# Patient Record
Sex: Male | Born: 1965 | Race: White | Hispanic: No | Marital: Single | Smoking: Current some day smoker
Health system: Southern US, Community
[De-identification: ages and names within clinical notes are randomized; demographics above are authoritative.]

## PROBLEM LIST (undated history)

## (undated) DIAGNOSIS — F419 Anxiety disorder, unspecified: Secondary | ICD-10-CM

## (undated) DIAGNOSIS — F329 Major depressive disorder, single episode, unspecified: Secondary | ICD-10-CM

## (undated) DIAGNOSIS — F32A Depression, unspecified: Secondary | ICD-10-CM

## (undated) DIAGNOSIS — F1010 Alcohol abuse, uncomplicated: Secondary | ICD-10-CM

---

## 2008-01-22 ENCOUNTER — Emergency Department (HOSPITAL_COMMUNITY): Admission: EM | Admit: 2008-01-22 | Discharge: 2008-01-22 | Payer: Self-pay | Admitting: Emergency Medicine

## 2009-04-30 ENCOUNTER — Emergency Department (HOSPITAL_COMMUNITY): Admission: EM | Admit: 2009-04-30 | Discharge: 2009-04-30 | Payer: Self-pay | Admitting: Emergency Medicine

## 2009-11-20 ENCOUNTER — Emergency Department (HOSPITAL_COMMUNITY): Admission: EM | Admit: 2009-11-20 | Discharge: 2009-11-21 | Payer: Self-pay | Admitting: Emergency Medicine

## 2009-12-11 ENCOUNTER — Emergency Department (HOSPITAL_COMMUNITY): Admission: EM | Admit: 2009-12-11 | Discharge: 2009-12-12 | Payer: Self-pay | Admitting: Emergency Medicine

## 2010-09-23 IMAGING — CR DG CHEST 2V
2 series · 2 of 2 positions shown · non-contrast
Comparison: None

CLINICAL DATA: Weakness

CHEST - 2 VIEW

[w chest pa]
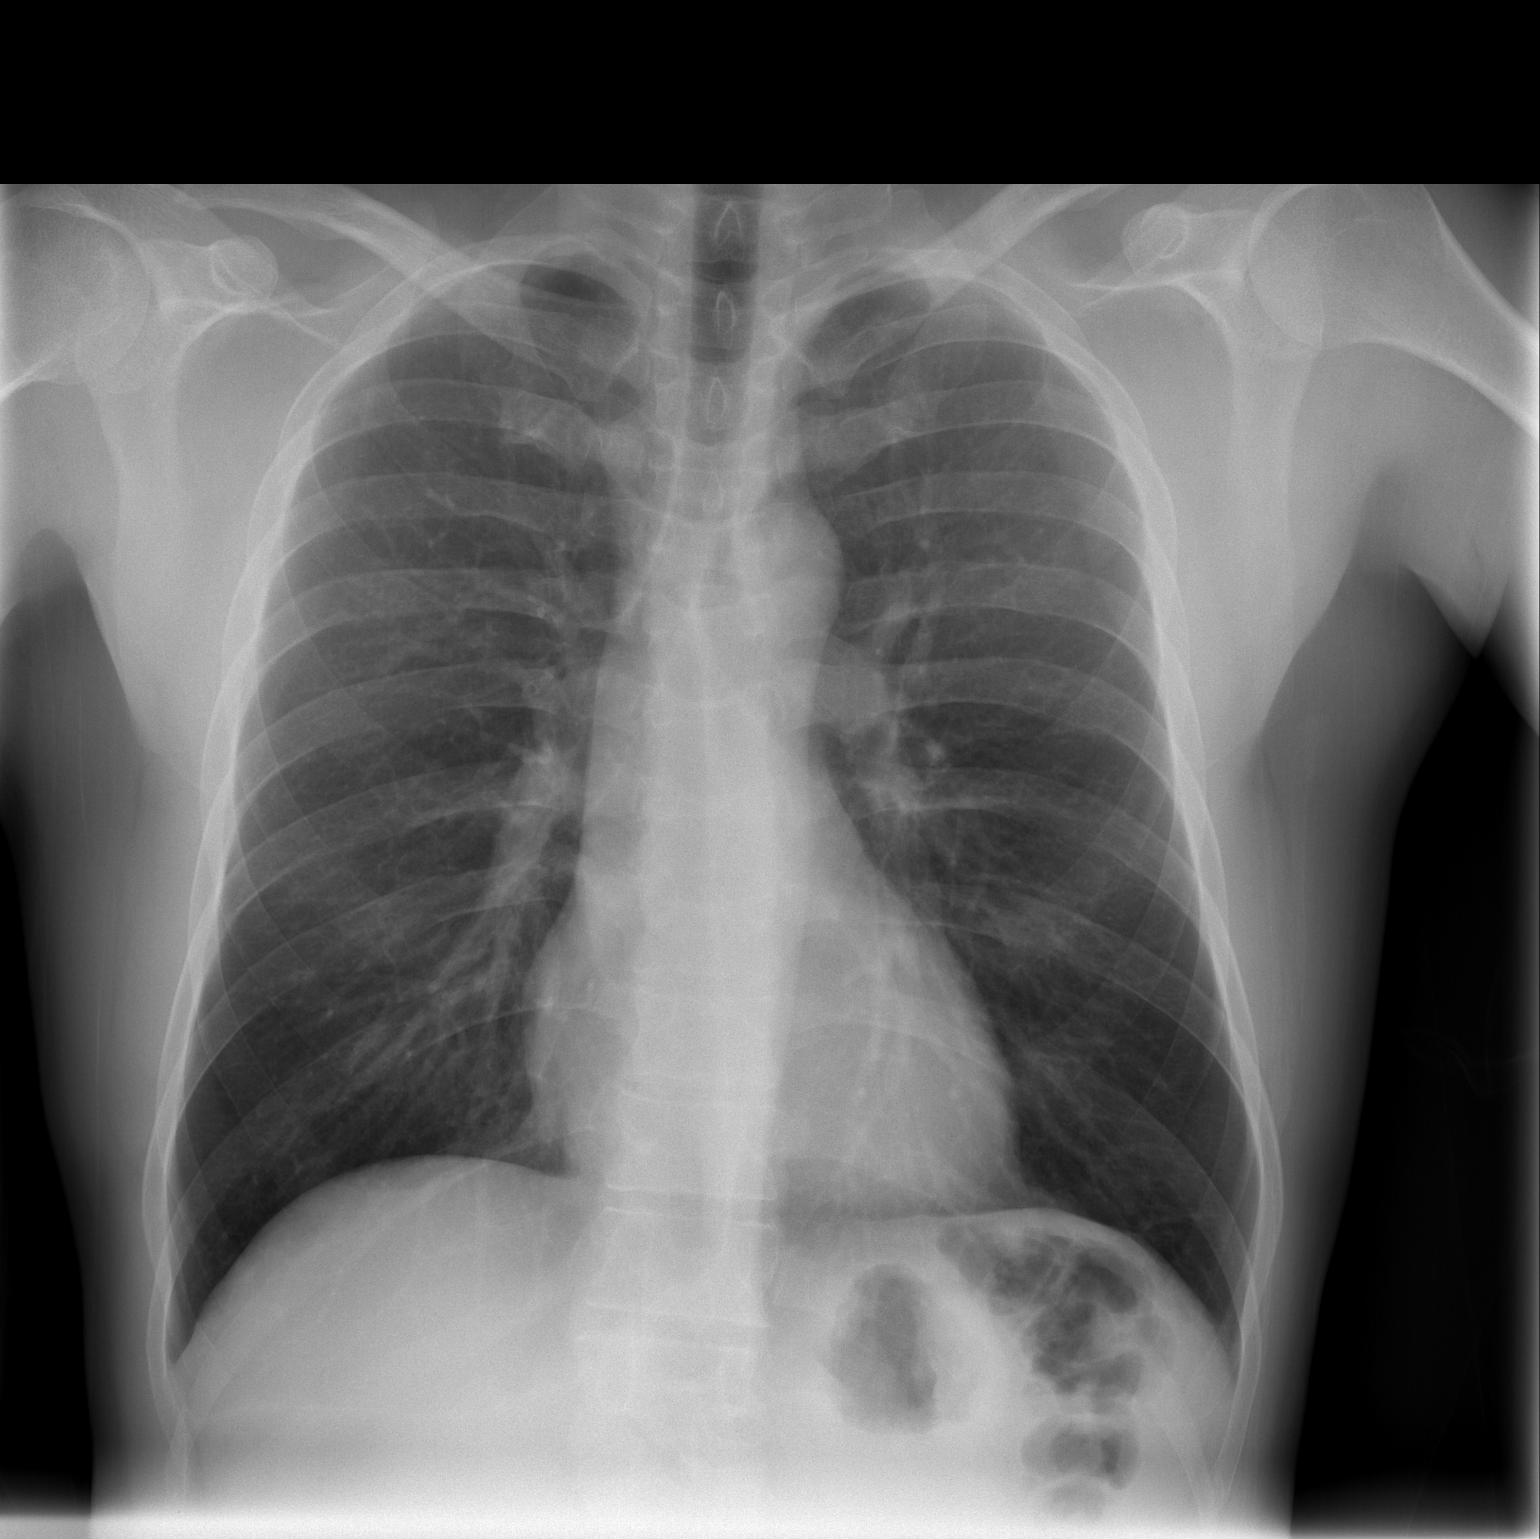

[w chest lat]
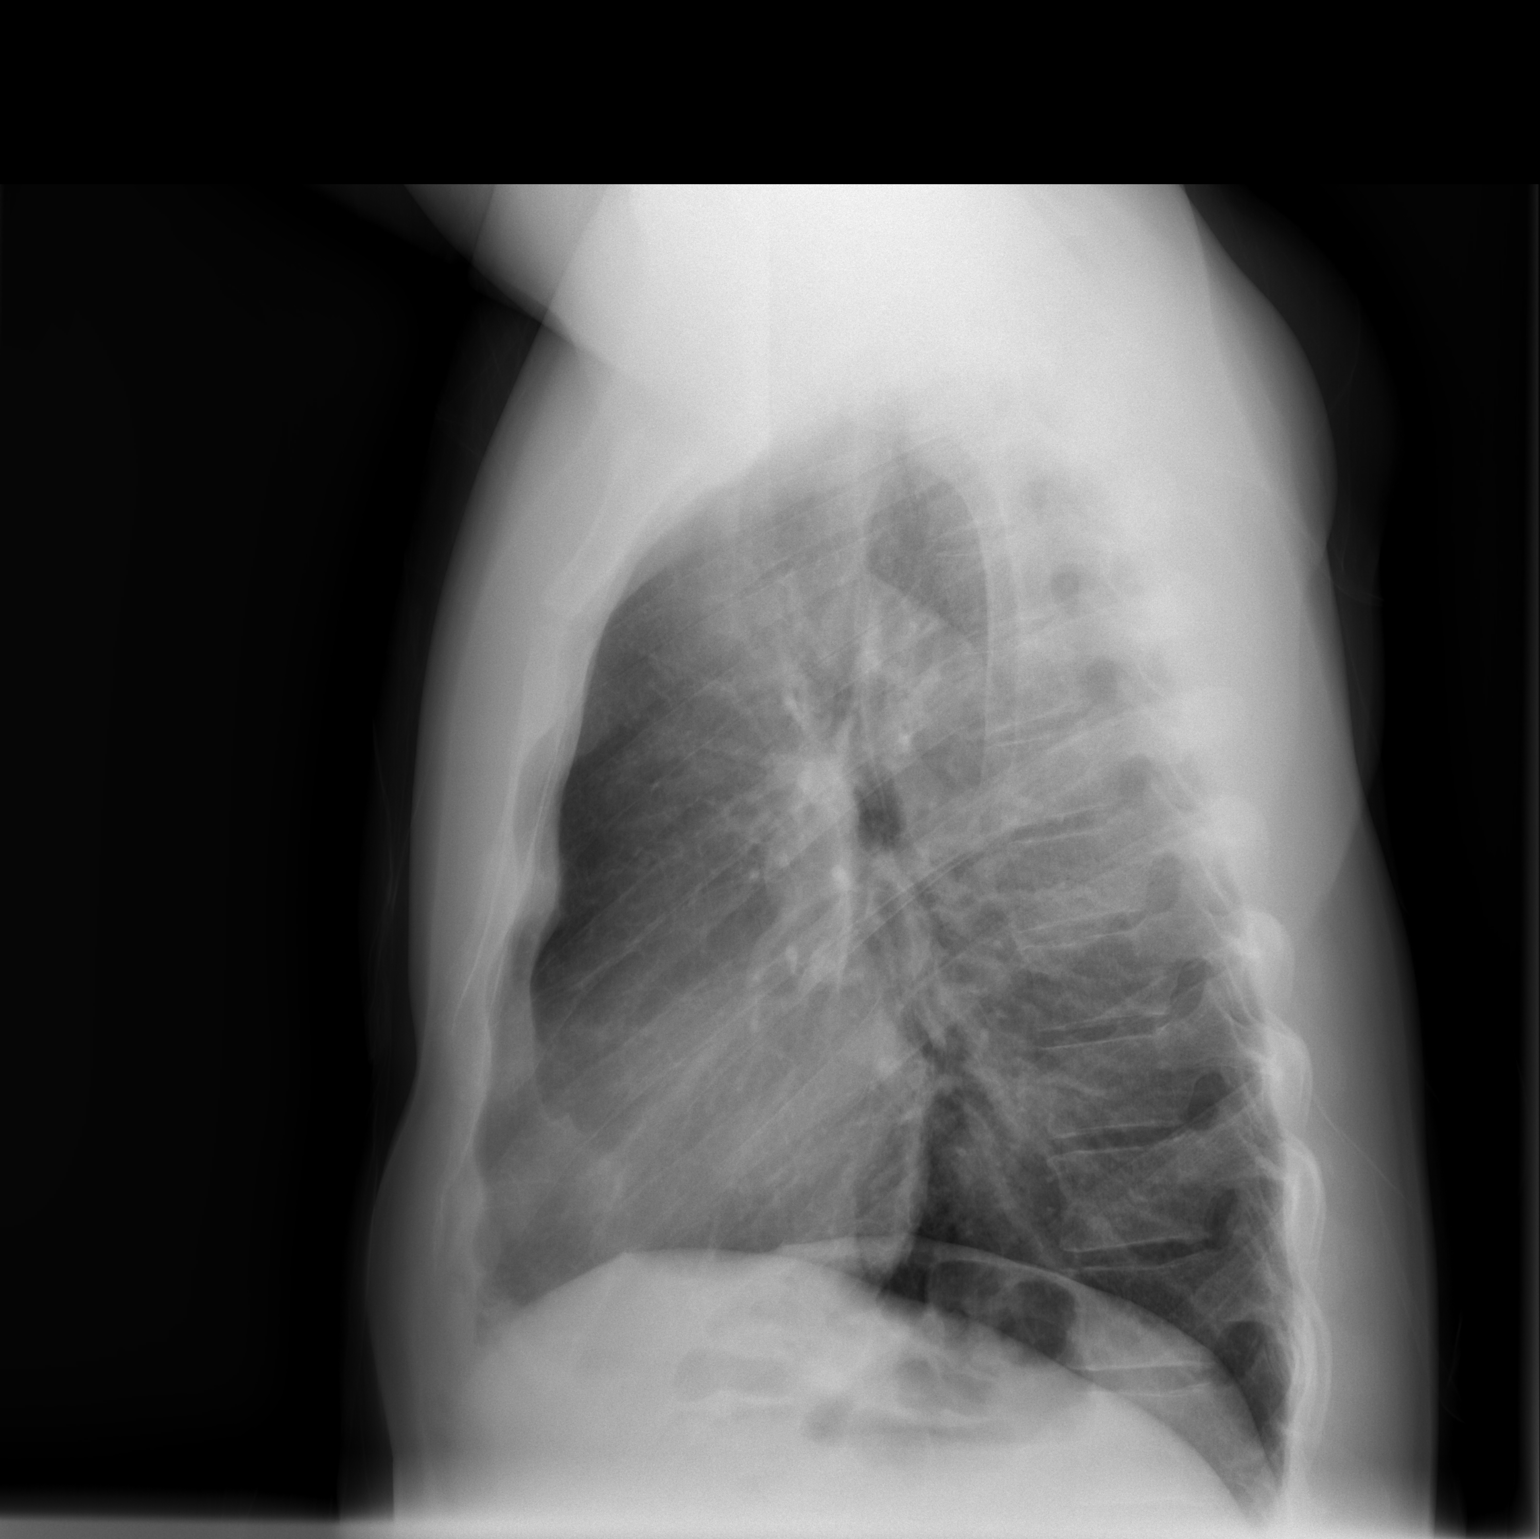

[2 of 2 positions shown; findings below may reference images not displayed]

FINDINGS: The heart and mediastinal contours are within normal
limits.  The lungs are normally aerated and the lung fields are
clear.  No pleural effusion or pneumothorax is identified.  The
trachea is midline.  No evidence of lymphadenopathy.  No acute bony
abnormality is identified.
IMPRESSION: No evidence of acute cardiopulmonary disease.

## 2010-10-24 LAB — CBC
HCT: 45.7 % (ref 39.0–52.0)
Hemoglobin: 15.4 g/dL (ref 13.0–17.0)
RBC: 4.65 MIL/uL (ref 4.22–5.81)
WBC: 7.4 10*3/uL (ref 4.0–10.5)

## 2010-10-24 LAB — DIFFERENTIAL
Basophils Absolute: 0 10*3/uL (ref 0.0–0.1)
Eosinophils Relative: 0 % (ref 0–5)
Lymphocytes Relative: 34 % (ref 12–46)
Lymphs Abs: 2.5 10*3/uL (ref 0.7–4.0)
Monocytes Absolute: 0.5 10*3/uL (ref 0.1–1.0)
Neutro Abs: 4.3 10*3/uL (ref 1.7–7.7)

## 2010-10-24 LAB — ETHANOL
Alcohol, Ethyl (B): 214 mg/dL — ABNORMAL HIGH (ref 0–10)
Alcohol, Ethyl (B): 289 mg/dL — ABNORMAL HIGH (ref 0–10)

## 2010-10-24 LAB — POCT I-STAT, CHEM 8
Chloride: 106 mEq/L (ref 96–112)
Glucose, Bld: 87 mg/dL (ref 70–99)
HCT: 49 % (ref 39.0–52.0)
Hemoglobin: 16.7 g/dL (ref 13.0–17.0)
Potassium: 3.5 mEq/L (ref 3.5–5.1)
Sodium: 143 mEq/L (ref 135–145)

## 2010-10-24 LAB — RAPID URINE DRUG SCREEN, HOSP PERFORMED
Amphetamines: NOT DETECTED
Amphetamines: NOT DETECTED
Barbiturates: NOT DETECTED
Benzodiazepines: NOT DETECTED
Benzodiazepines: NOT DETECTED
Cocaine: POSITIVE — AB
Tetrahydrocannabinol: NOT DETECTED

## 2010-10-24 LAB — BASIC METABOLIC PANEL
Calcium: 8.9 mg/dL (ref 8.4–10.5)
GFR calc Af Amer: >60 mL/min (ref >60)
GFR calc non Af Amer: >60 mL/min (ref >60)
Potassium: 3.5 mEq/L (ref 3.5–5.1)
Sodium: 138 mEq/L (ref 135–145)

## 2010-10-24 LAB — TRICYCLICS SCREEN, URINE: TCA Scrn: NOT DETECTED

## 2010-11-10 LAB — POCT I-STAT, CHEM 8
HCT: 42 % (ref 39.0–52.0)
Hemoglobin: 14.3 g/dL (ref 13.0–17.0)
Potassium: 3.5 mEq/L (ref 3.5–5.1)
Sodium: 136 mEq/L (ref 135–145)
TCO2: 23 mmol/L (ref 0–100)

## 2010-11-10 LAB — URINALYSIS, ROUTINE W REFLEX MICROSCOPIC
Hgb urine dipstick: NEGATIVE
Nitrite: NEGATIVE
Protein, ur: NEGATIVE mg/dL
Specific Gravity, Urine: 1.004 — ABNORMAL LOW (ref 1.005–1.030)
Urobilinogen, UA: 0.2 mg/dL (ref 0.0–1.0)

## 2010-11-10 LAB — RAPID URINE DRUG SCREEN, HOSP PERFORMED
Amphetamines: NOT DETECTED
Barbiturates: NOT DETECTED

## 2010-11-10 LAB — CBC
HCT: 40.8 % (ref 39.0–52.0)
MCHC: 34.7 g/dL (ref 30.0–36.0)
MCV: 90.6 fL (ref 78.0–100.0)
Platelets: 229 10*3/uL (ref 150–400)
RBC: 4.5 MIL/uL (ref 4.22–5.81)
WBC: 7.1 10*3/uL (ref 4.0–10.5)

## 2010-11-10 LAB — DIFFERENTIAL
Basophils Relative: 0 % (ref 0–1)
Eosinophils Absolute: 0 10*3/uL (ref 0.0–0.7)
Eosinophils Relative: 0 % (ref 0–5)
Lymphs Abs: 2.2 10*3/uL (ref 0.7–4.0)
Monocytes Relative: 7 % (ref 3–12)
Neutrophils Relative %: 61 % (ref 43–77)

## 2010-11-10 LAB — URINE CULTURE
Colony Count: NO GROWTH
Culture: NO GROWTH

## 2011-05-03 LAB — CBC
HCT: 48.5
Hemoglobin: 16.5
MCHC: 34
MCV: 95.9
Platelets: 264
RBC: 5.06
RDW: 13.7
WBC: 7.9

## 2011-05-03 LAB — BASIC METABOLIC PANEL
BUN: 6
Chloride: 103
GFR calc non Af Amer: >60
Glucose, Bld: 94
Potassium: 4.4

## 2011-05-03 LAB — DIFFERENTIAL
Basophils Absolute: 0
Basophils Relative: 1
Eosinophils Absolute: 0.1
Eosinophils Relative: 1
Lymphocytes Relative: 23
Lymphs Abs: 1.9
Monocytes Absolute: 0.6
Monocytes Relative: 8
Neutro Abs: 5.3
Neutrophils Relative %: 67

## 2011-05-03 LAB — BASIC METABOLIC PANEL WITH GFR
CO2: 28
Calcium: 9.8
Creatinine, Ser: 0.91
GFR calc Af Amer: >60
Sodium: 139

## 2016-06-28 ENCOUNTER — Emergency Department (HOSPITAL_COMMUNITY)
Admission: EM | Admit: 2016-06-28 | Discharge: 2016-06-28 | Disposition: A | Discharge status: Home / Self Care | Payer: Self-pay | Attending: Emergency Medicine | Admitting: Emergency Medicine

## 2016-06-28 ENCOUNTER — Encounter (HOSPITAL_COMMUNITY): Payer: Self-pay

## 2016-06-28 DIAGNOSIS — F17210 Nicotine dependence, cigarettes, uncomplicated: Secondary | ICD-10-CM | POA: Insufficient documentation

## 2016-06-28 DIAGNOSIS — Z79899 Other long term (current) drug therapy: Secondary | ICD-10-CM | POA: Insufficient documentation

## 2016-06-28 DIAGNOSIS — F1010 Alcohol abuse, uncomplicated: Secondary | ICD-10-CM | POA: Insufficient documentation

## 2016-06-28 HISTORY — DX: Depression, unspecified: F32.A

## 2016-06-28 HISTORY — DX: Anxiety disorder, unspecified: F41.9

## 2016-06-28 HISTORY — DX: Major depressive disorder, single episode, unspecified: F32.9

## 2016-06-28 HISTORY — DX: Alcohol abuse, uncomplicated: F10.10

## 2016-06-28 LAB — CBC
HCT: 42.3 % (ref 39.0–52.0)
Hemoglobin: 14.6 g/dL (ref 13.0–17.0)
MCH: 31.3 pg (ref 26.0–34.0)
MCHC: 34.5 g/dL (ref 30.0–36.0)
MCV: 90.8 fL (ref 78.0–100.0)
PLATELETS: 293 10*3/uL (ref 150–400)
RBC: 4.66 MIL/uL (ref 4.22–5.81)
RDW: 13.2 % (ref 11.5–15.5)
WBC: 7.5 10*3/uL (ref 4.0–10.5)

## 2016-06-28 LAB — COMPREHENSIVE METABOLIC PANEL
ALK PHOS: 73 U/L (ref 38–126)
ALT: 69 U/L — AB (ref 17–63)
ANION GAP: 8 (ref 5–15)
AST: 57 U/L — ABNORMAL HIGH (ref 15–41)
Albumin: 4.2 g/dL (ref 3.5–5.0)
BUN: 7 mg/dL (ref 6–20)
CALCIUM: 8.6 mg/dL — AB (ref 8.9–10.3)
CO2: 27 mmol/L (ref 22–32)
CREATININE: 0.8 mg/dL (ref 0.61–1.24)
Chloride: 107 mmol/L (ref 101–111)
Glucose, Bld: 101 mg/dL — ABNORMAL HIGH (ref 65–99)
Potassium: 3.4 mmol/L — ABNORMAL LOW (ref 3.5–5.1)
SODIUM: 142 mmol/L (ref 135–145)
TOTAL PROTEIN: 7 g/dL (ref 6.5–8.1)
Total Bilirubin: 1.1 mg/dL (ref 0.3–1.2)

## 2016-06-28 LAB — RAPID URINE DRUG SCREEN, HOSP PERFORMED
AMPHETAMINES: NOT DETECTED
BENZODIAZEPINES: POSITIVE — AB
Barbiturates: NOT DETECTED
COCAINE: NOT DETECTED
OPIATES: NOT DETECTED
Tetrahydrocannabinol: POSITIVE — AB

## 2016-06-28 LAB — ETHANOL: ALCOHOL ETHYL (B): 158 mg/dL — AB (ref <5)

## 2016-06-28 MED ORDER — FOLIC ACID 1 MG PO TABS: 1.0000 mg | ORAL_TABLET | Freq: Every day | ORAL | 0 refills | Status: DC

## 2016-06-28 MED ORDER — CHLORDIAZEPOXIDE HCL 25 MG PO CAPS: ORAL_CAPSULE | ORAL | 0 refills | Status: DC

## 2016-06-28 MED ORDER — VITAMIN B-1 100 MG PO TABS: 100.0000 mg | ORAL_TABLET | Freq: Every day | ORAL | 0 refills | Status: DC

## 2016-06-28 MED ORDER — LORAZEPAM 1 MG PO TABS
1.0000 mg | ORAL_TABLET | Freq: Once | ORAL | Status: AC
  Administered 2016-06-28: 1 mg via ORAL
  Filled 2016-06-28: qty 1

## 2016-06-28 MED ORDER — ONE-A-DAY MENS PO TABS: 1.0000 | ORAL_TABLET | Freq: Every day | ORAL | 0 refills | Status: DC

## 2016-06-28 NOTE — ED Provider Notes (Signed)
[facility]-EMERGENCY DEPT Provider Note   CSN: [id number] Arrival date & time: 06/28/16  1807     History   Chief Complaint Chief Complaint  Patient presents with  . Alcohol Problem    HPI Matthew Bowen is a 50 y.o. male.  The history is provided by the patient. No language interpreter was used.  Alcohol Problem    Matthew Bowen is a 50 y.o. male who presents to the Emergency Department complaining of alcohol problem.  She has a history of alcohol abuse and states that he drinks as much beer as he can find. He has been drinking for the last 3 weeks and comes in today seeking help for alcohol abuse. He has a history of alcohol withdrawal with shaking and paranoia and schizophrenic-like features. He has a history of mild seizures with alcohol withdrawal. Lives and states she does not feel safe leaving with getting help for his alcohol problem. Denies any SI or HI. He states that he wants help with his drinking because he can't live like this anymore. He states he smoked marijuana last night but does not typically use drugs. Past Medical History:  Diagnosis Date  . Alcohol abuse   . Anxiety   . Depression     There are no active problems to display for this patient.   History reviewed. No pertinent surgical history.     Home Medications    Prior to Admission medications   Medication Sig Start Date End Date Taking? Authorizing Provider  chlordiazePOXIDE (LIBRIUM) 25 MG capsule 50mg  PO TID x 1D, then 25-50mg  PO BID X 1D, then 25-50mg  PO QD X 1D 06/28/16   Tilden FossaElizabeth Nekeya Briski, MD  folic acid (FOLVITE) 1 MG tablet Take 1 tablet (1 mg total) by mouth daily. 06/28/16   Tilden FossaElizabeth Breyden Jeudy, MD  multivitamin (ONE-A-DAY MEN'S) TABS tablet Take 1 tablet by mouth daily. 06/28/16   Tilden FossaElizabeth Marka Treloar, MD  thiamine (VITAMIN B-1) 100 MG tablet Take 1 tablet (100 mg total) by mouth daily. 06/28/16   Tilden FossaElizabeth Deanie Jupiter, MD    Family History History reviewed. No pertinent family history.  Social  History Social History  Substance Use Topics  . Smoking status: Current Some Day Smoker    Packs/day: 0.50    Types: Cigarettes  . Smokeless tobacco: Never Used  . Alcohol use 7.2 oz/week    12 Cans of beer per week     Comment: DAILY     Allergies   Patient has no known allergies.   Review of Systems Review of Systems  All other systems reviewed and are negative.    Physical Exam Updated Vital Signs BP 112/77 (BP Location: Left Arm)   Pulse (!) 56   Temp 97.9 F (36.6 C) (Oral)   Resp 17   Ht 5\' 9"  (1.753 m)   Wt 150 lb (68 kg)   SpO2 97%   BMI 22.15 kg/m   Physical Exam  Constitutional: He is oriented to person, place, and time. He appears well-developed and well-nourished.  HENT:  Head: Normocephalic and atraumatic.  Cardiovascular: Normal rate and regular rhythm.   No murmur heard. Pulmonary/Chest: Effort normal and breath sounds normal. No respiratory distress.  Abdominal: Soft. There is no tenderness. There is no rebound and no guarding.  Musculoskeletal: He exhibits no edema or tenderness.  Neurological: He is alert and oriented to person, place, and time.  Mild resting tremor in hands.  Skin: Skin is warm and dry.  Psychiatric: He has a normal mood  and affect. His behavior is normal.  Nursing note and vitals reviewed.    ED Treatments / Results  Labs (all labs ordered are listed, but only abnormal results are displayed) Labs Reviewed  COMPREHENSIVE METABOLIC PANEL - Abnormal; Notable for the following:       Result Value   Potassium 3.4 (*)    Glucose, Bld 101 (*)    Calcium 8.6 (*)    AST 57 (*)    ALT 69 (*)    All other components within normal limits  ETHANOL - Abnormal; Notable for the following:    Alcohol, Ethyl (B) 158 (*)    All other components within normal limits  RAPID URINE DRUG SCREEN, HOSP PERFORMED - Abnormal; Notable for the following:    Benzodiazepines POSITIVE (*)    Tetrahydrocannabinol POSITIVE (*)    All other  components within normal limits  CBC    EKG  EKG Interpretation None       Radiology No results found.  Procedures Procedures (including critical care time)  Medications Ordered in ED Medications  LORazepam (ATIVAN) tablet 1 mg (1 mg Oral Given 06/28/16 1911)     Initial Impression / Assessment and Plan / ED Course  I have reviewed the triage vital signs and the nursing notes.  Pertinent labs & imaging results that were available during my care of the patient were reviewed by me and considered in my medical decision making (see chart for details).  Clinical Course     Patient with history of alcohol abuse here seeking treatment for alcohol abuse. He does not appear to be intoxicated or actively withdrawing in the emergency department. He does not have any SI, HI, psychosis. He was offered outpatient resources for alcohol treatment and he was provided prescription to help prevent withdrawal. Discussed home care, outpatient follow-up and return precautions.  Final Clinical Impressions(s) / ED Diagnoses   Final diagnoses:  Alcohol abuse    New Prescriptions Discharge Medication List as of 06/28/2016  8:22 PM    START taking these medications   Details  chlordiazePOXIDE (LIBRIUM) 25 MG capsule 50mg  PO TID x 1D, then 25-50mg  PO BID X 1D, then 25-50mg  PO QD X 1D, Print    folic acid (FOLVITE) 1 MG tablet Take 1 tablet (1 mg total) by mouth daily., Starting Thu 06/28/2016, Print    multivitamin (ONE-A-DAY MEN'S) TABS tablet Take 1 tablet by mouth daily., Starting Thu 06/28/2016, Print    thiamine (VITAMIN B-1) 100 MG tablet Take 1 tablet (100 mg total) by mouth daily., Starting Thu 06/28/2016, Print         Tilden FossaElizabeth Amala Petion, MD 06/29/16 [phone]

## 2016-06-28 NOTE — ED Triage Notes (Signed)
PT HERE REQUESTING DETOX FROM ALCOHOL. PT STS HE'S BEEN DRINKING EVERY DAY. PT STS HE DRANK AT LEAST 8 BEERS TODAY, HIS LAST WAS AROUND 4 PM. PT C/O TREMORS AND CHRONIC LEFT FOOT PAIN. DENIES SI/HI. PT STS HE HOPES HE CAN STICK WITH THE DETOX PROCESS THIS TIME AROUND.

## 2017-04-18 ENCOUNTER — Encounter (HOSPITAL_COMMUNITY): Payer: Self-pay | Admitting: Emergency Medicine

## 2017-04-18 ENCOUNTER — Emergency Department (HOSPITAL_COMMUNITY)
Admission: EM | Admit: 2017-04-18 | Discharge: 2017-04-18 | Disposition: A | Discharge status: Home / Self Care | Payer: Self-pay | Attending: Emergency Medicine | Admitting: Emergency Medicine

## 2017-04-18 DIAGNOSIS — R112 Nausea with vomiting, unspecified: Secondary | ICD-10-CM | POA: Insufficient documentation

## 2017-04-18 DIAGNOSIS — R1084 Generalized abdominal pain: Secondary | ICD-10-CM | POA: Insufficient documentation

## 2017-04-18 DIAGNOSIS — F17210 Nicotine dependence, cigarettes, uncomplicated: Secondary | ICD-10-CM | POA: Insufficient documentation

## 2017-04-18 DIAGNOSIS — R300 Dysuria: Secondary | ICD-10-CM | POA: Insufficient documentation

## 2017-04-18 DIAGNOSIS — Z79899 Other long term (current) drug therapy: Secondary | ICD-10-CM | POA: Insufficient documentation

## 2017-04-18 DIAGNOSIS — F1010 Alcohol abuse, uncomplicated: Secondary | ICD-10-CM | POA: Insufficient documentation

## 2017-04-18 LAB — COMPREHENSIVE METABOLIC PANEL
ALBUMIN: 4.3 g/dL (ref 3.5–5.0)
ALT: 19 U/L (ref 17–63)
AST: 28 U/L (ref 15–41)
Alkaline Phosphatase: 69 U/L (ref 38–126)
Anion gap: 14 (ref 5–15)
BUN: 12 mg/dL (ref 6–20)
CO2: 24 mmol/L (ref 22–32)
CREATININE: 0.85 mg/dL (ref 0.61–1.24)
Calcium: 8.7 mg/dL — ABNORMAL LOW (ref 8.9–10.3)
Chloride: 106 mmol/L (ref 101–111)
GFR calc Af Amer: >60 mL/min (ref >60)
GFR calc non Af Amer: >60 mL/min (ref >60)
GLUCOSE: 114 mg/dL — AB (ref 65–99)
Potassium: 3.9 mmol/L (ref 3.5–5.1)
Sodium: 144 mmol/L (ref 135–145)
TOTAL PROTEIN: 7.4 g/dL (ref 6.5–8.1)
Total Bilirubin: 0.6 mg/dL (ref 0.3–1.2)

## 2017-04-18 LAB — CBC WITH DIFFERENTIAL/PLATELET
BASOS ABS: 0 10*3/uL (ref 0.0–0.1)
BASOS PCT: 0 %
Eosinophils Absolute: 0 10*3/uL (ref 0.0–0.7)
Eosinophils Relative: 0 %
HCT: 43.6 % (ref 39.0–52.0)
HEMOGLOBIN: 15.2 g/dL (ref 13.0–17.0)
Lymphocytes Relative: 45 %
Lymphs Abs: 3.5 10*3/uL (ref 0.7–4.0)
MCH: 31.3 pg (ref 26.0–34.0)
MCHC: 34.9 g/dL (ref 30.0–36.0)
MCV: 89.9 fL (ref 78.0–100.0)
Monocytes Absolute: 0.5 10*3/uL (ref 0.1–1.0)
Monocytes Relative: 6 %
NEUTROS ABS: 3.8 10*3/uL (ref 1.7–7.7)
NEUTROS PCT: 49 %
Platelets: 269 10*3/uL (ref 150–400)
RBC: 4.85 MIL/uL (ref 4.22–5.81)
RDW: 13 % (ref 11.5–15.5)
WBC: 7.8 10*3/uL (ref 4.0–10.5)

## 2017-04-18 LAB — ETHANOL: Alcohol, Ethyl (B): 336 mg/dL (ref <5)

## 2017-04-18 LAB — LIPASE, BLOOD: Lipase: 26 U/L (ref 11–51)

## 2017-04-18 NOTE — ED Notes (Signed)
Pt seen with wine bottle in lobby. Pt seen throwing one empty bottle away.

## 2017-04-18 NOTE — ED Triage Notes (Signed)
Per [facility] patient requesting detox for ETOH. Patient was picked up at [employer].

## 2017-04-18 NOTE — ED Provider Notes (Signed)
[facility]PT Provider Note   CSN: [id number] Arrival date & time: 04/18/17  1610     History   Chief Complaint Chief Complaint  Patient presents with  . detox    HPI Matthew Bowen is a 51 y.o. male who presents to the ED for detox. The patient reports that he can not stop drinking without help. Patient states that he is having abdominal pain that started 2 days ago and has gotten worse. He reports nausea and vomiting associated with drinking alcohol. Patient denies S/I or H/I.  The history is provided by the patient. No language interpreter was used.  Abdominal Pain   This is a new problem. The current episode started 2 days ago. The problem has been gradually worsening. The pain is associated with alcohol use. The pain is located in the generalized abdominal region. The pain is at a severity of 4/10. Associated symptoms include nausea, vomiting, dysuria and myalgias. Pertinent negatives include fever and headaches. The symptoms are aggravated by drinking alcohol. Nothing relieves the symptoms.    Past Medical History:  Diagnosis Date  . Alcohol abuse   . Anxiety   . Depression     There are no active problems to display for this patient.   History reviewed. No pertinent surgical history.     Home Medications    Prior to Admission medications   Medication Sig Start Date End Date Taking? Authorizing Provider  chlordiazePOXIDE (LIBRIUM) 25 MG capsule 50mg  PO TID x 1D, then 25-50mg  PO BID X 1D, then 25-50mg  PO QD X 1D 06/28/16   Tilden Fossaees, Elizabeth, MD  folic acid (FOLVITE) 1 MG tablet Take 1 tablet (1 mg total) by mouth daily. 06/28/16   Tilden Fossaees, Elizabeth, MD  multivitamin (ONE-A-DAY MEN'S) TABS tablet Take 1 tablet by mouth daily. 06/28/16   Tilden Fossaees, Elizabeth, MD  thiamine (VITAMIN B-1) 100 MG tablet Take 1 tablet (100 mg total) by mouth daily. 06/28/16   Tilden Fossaees, Elizabeth, MD    Family History No family history on file.  Social History Social History  Substance Use  Topics  . Smoking status: Current Some Day Smoker    Packs/day: 0.50    Types: Cigarettes  . Smokeless tobacco: Never Used  . Alcohol use Yes     Comment: DAILY     Allergies   Patient has no known allergies.   Review of Systems Review of Systems  Constitutional: Negative for appetite change and fever.  HENT: Positive for sore throat.   Eyes: Negative for visual disturbance.  Respiratory: Negative for shortness of breath.   Cardiovascular: Negative for chest pain.  Gastrointestinal: Positive for abdominal pain, nausea and vomiting.  Genitourinary: Positive for dysuria. Negative for discharge, scrotal swelling and testicular pain.  Musculoskeletal: Positive for myalgias.  Skin: Negative for rash.  Neurological: Negative for syncope and headaches.  Psychiatric/Behavioral: The patient is nervous/anxious (hx of but stopped meds).      Physical Exam Updated Vital Signs BP (!) 119/92 (BP Location: Left Arm)   Pulse 87   Temp 98.5 F (36.9 C) (Oral)   Resp 18   SpO2 100%   Physical Exam  Constitutional: He appears well-developed and well-nourished. No distress.  HENT:  Head: Normocephalic and atraumatic.  Mouth/Throat: Uvula is midline, oropharynx is clear and moist and mucous membranes are normal.  Eyes: Pupils are equal, round, and reactive to light. EOM are normal.  Neck: Normal range of motion. Neck supple.  Cardiovascular: Normal rate and regular rhythm.   Pulmonary/Chest:  Effort normal and breath sounds normal.  Abdominal: Soft. There is tenderness in the right upper quadrant. There is no rebound, no guarding and no CVA tenderness.  Musculoskeletal: Normal range of motion.  Neurological: He is alert.  Skin: Skin is warm and dry.  Nursing note and vitals reviewed.    ED Treatments / Results  Labs (all labs ordered are listed, but only abnormal results are displayed) Labs Reviewed  COMPREHENSIVE METABOLIC PANEL - Abnormal; Notable for the following:        Result Value   Glucose, Bld 114 (*)    Calcium 8.7 (*)    All other components within normal limits  ETHANOL - Abnormal; Notable for the following:    Alcohol, Ethyl (B) 336 (*)    All other components within normal limits  CBC WITH DIFFERENTIAL/PLATELET  LIPASE, BLOOD  URINALYSIS, ROUTINE W REFLEX MICROSCOPIC   Radiology No results found.  Procedures Procedures (including critical care time)  Medications Ordered in ED Medications - No data to display   Initial Impression / Assessment and Plan / ED Course  I have reviewed the triage vital signs and the nursing notes. 51 y.o. male with request for detox stable for d/c. Patient given resource guide for f/u. Patient is not driving at discharge.   Final Clinical Impressions(s) / ED Diagnoses   Final diagnoses:  Alcohol abuse    New Prescriptions Discharge Medication List as of 04/18/2017  9:02 PM       Kerrie [place], NP 04/19/17 Merri Brunette, MD 04/20/17 [phone]

## 2017-04-18 NOTE — ED Notes (Signed)
Bed: WTR6 Expected date:  Expected time:  Means of arrival:  Comments: 

## 2017-04-18 NOTE — ED Triage Notes (Signed)
Patient reports that he drinks "alot, a lot everyday, I just need help to quit. I drink so much til I throw up". Patient denies SI or HI at this time.  Patient last drink was hour ago.

## 2017-04-19 ENCOUNTER — Emergency Department (HOSPITAL_COMMUNITY)
Admission: EM | Admit: 2017-04-19 | Discharge: 2017-04-19 | Disposition: A | Discharge status: Home / Self Care | Payer: Self-pay | Attending: Emergency Medicine | Admitting: Emergency Medicine

## 2017-04-19 ENCOUNTER — Encounter (HOSPITAL_COMMUNITY): Payer: Self-pay

## 2017-04-19 DIAGNOSIS — F1019 Alcohol abuse with unspecified alcohol-induced disorder: Secondary | ICD-10-CM | POA: Insufficient documentation

## 2017-04-19 DIAGNOSIS — F17210 Nicotine dependence, cigarettes, uncomplicated: Secondary | ICD-10-CM | POA: Insufficient documentation

## 2017-04-19 DIAGNOSIS — F1010 Alcohol abuse, uncomplicated: Secondary | ICD-10-CM

## 2017-04-19 DIAGNOSIS — Z79899 Other long term (current) drug therapy: Secondary | ICD-10-CM | POA: Insufficient documentation

## 2017-04-19 MED ORDER — FOLIC ACID 1 MG PO TABS: 1.0000 mg | ORAL_TABLET | Freq: Every day | ORAL | 0 refills | Status: AC

## 2017-04-19 MED ORDER — ONE-A-DAY MENS PO TABS: 1.0000 | ORAL_TABLET | Freq: Every day | ORAL | 0 refills | Status: AC

## 2017-04-19 MED ORDER — VITAMIN B-1 100 MG PO TABS: 100.0000 mg | ORAL_TABLET | Freq: Every day | ORAL | 0 refills | Status: AC

## 2017-04-19 MED ORDER — CHLORDIAZEPOXIDE HCL 25 MG PO CAPS: ORAL_CAPSULE | ORAL | 0 refills | Status: AC

## 2017-04-19 NOTE — Discharge Instructions (Signed)
You were seen here today for alcohol detox. I have provided you with resources in order to get help for this.   I am prescribing you a medication to take to manage your withdrawal symptoms. DO NOT DRINK ALCOHOL AND COMBINE THIS MEDICATION. IT CAN CAUSE DANGEROUS/LIFE THREATENING SIDE EFFECTS.  I am prescribing you vitamins to take alongside your detox medications. Take as directed.   It is important you establish care with a primary care doctor so you can have routine blood work. Please use the resources given to get help for your alcohol detox.

## 2017-04-19 NOTE — ED Provider Notes (Signed)
[facility]PT Provider Note   CSN: [id number] Arrival date & time: 04/19/17  1946     History   Chief Complaint Chief Complaint  Patient presents with  . Alcohol Intoxication    HPI Matthew Bowen is a 51 y.o. male with a history of alcohol abuse, anxiety and depression who presents emergent department today for alcohol detox. The patient was seen yesterday for the same and given resources. He states that he was not aware that there is resources in his discharge paperwork that he got discharge paperwork at all. He states "all the different from he was give blood work until medical home". The patient reports that he is unhappy with his drinking habits. This has been ongoing for several years. Per chart review it appears that the patient had a trial of detox in 2017 patient states he does not recollect this and is unsure if he gave the trial chance. The patient reports that he currently lives at home with his mother and is unemployed. He states he drinks 5-6 40 oz/day. His last drink 2-3 hours ago. He states he has a resting tremor but has not had any recent seizures, chest pain, shortness of breath. He does note that he has some nausea with no vomiting and reports that his abdominal pain from yesterday has resolved. The patient states she is unhappy with his drinking habits but he does not have any suicidal ideation or homicidal ideation. The patient denies feeling anxious, agitated, auditory disturbances, visual disturbances, headache or any tactile disturbances.   HPI  Past Medical History:  Diagnosis Date  . Alcohol abuse   . Anxiety   . Depression     There are no active problems to display for this patient.   History reviewed. No pertinent surgical history.     Home Medications    Prior to Admission medications   Medication Sig Start Date End Date Taking? Authorizing Provider  chlordiazePOXIDE (LIBRIUM) 25 MG capsule  PO TID x 1D, then 25-50mg  PO BID X 1D, then  25-50mg  PO QD X 1D 04/19/17   Damarkus Balis, Elmer Sow, PA-C  folic acid (FOLVITE) 1 MG tablet Take 1 tablet (1 mg total) by mouth daily. 04/19/17   Iasha Mccalister, Elmer Sow, PA-C  multivitamin (ONE-A-DAY MEN'S) TABS tablet Take 1 tablet by mouth daily. 04/19/17   Jamarl Pew, Elmer Sow, PA-C  thiamine (VITAMIN B-1) 100 MG tablet Take 1 tablet (100 mg total) by mouth daily. 04/19/17   Tekeisha Hakim, Elmer Sow, PA-C    Family History No family history on file.  Social History Social History  Substance Use Topics  . Smoking status: Current Some Day Smoker    Packs/day: 0.50    Types: Cigarettes  . Smokeless tobacco: Never Used  . Alcohol use Yes     Comment: DAILY     Allergies   Patient has no known allergies.   Review of Systems Review of Systems  Constitutional: Negative for chills, fever and unexpected weight change.  Eyes: Negative for visual disturbance.  Respiratory: Negative for shortness of breath.   Cardiovascular: Negative for chest pain.  Gastrointestinal: Positive for nausea. Negative for abdominal pain and vomiting.  Genitourinary: Negative for frequency and urgency.  Musculoskeletal: Negative for arthralgias and gait problem.  Skin: Negative for color change and wound.  Neurological: Positive for tremors. Negative for seizures, numbness and headaches.  Psychiatric/Behavioral: Negative for agitation, confusion, hallucinations, self-injury and suicidal ideas. The patient is not nervous/anxious.   All other systems reviewed and are  negative.    Physical Exam Updated Vital Signs BP 134/84   Pulse 68   Temp 97.8 F (36.6 C) (Oral)   Resp 18   Ht  (1.753 m)   Wt 68 kg (150 lb)   SpO2 97%   BMI 22.15 kg/m   Physical Exam  Constitutional: He is oriented to person, place, and time. He appears well-developed and well-nourished.  HENT:  Head: Normocephalic and atraumatic.  Right Ear: External ear normal.  Left Ear: External ear normal.  Nose: Nose normal.  Mouth/Throat: Uvula is  midline, oropharynx is clear and moist and mucous membranes are normal. No tonsillar exudate.  Eyes: Pupils are equal, round, and reactive to light. Right eye exhibits no discharge. Left eye exhibits no discharge. No scleral icterus.  Neck: Trachea normal. Neck supple. No spinous process tenderness present. No neck rigidity. Normal range of motion present.  Cardiovascular: Normal rate, regular rhythm and intact distal pulses.   No murmur heard. Pulses:      Radial pulses are 2+ on the right side, and 2+ on the left side.       Dorsalis pedis pulses are 2+ on the right side, and 2+ on the left side.       Posterior tibial pulses are 2+ on the right side, and 2+ on the left side.  No lower extremity swelling or edema. Calves symmetric in size bilaterally.  Pulmonary/Chest: Effort normal and breath sounds normal. He exhibits no tenderness.  Abdominal: Soft. Bowel sounds are normal. There is no tenderness. There is no rebound and no guarding.  Musculoskeletal: He exhibits no edema.  Lymphadenopathy:    He has no cervical adenopathy.  Neurological: He is alert and oriented to person, place, and time.  Speech clear. Follows commands. No facial droop. PERRLA. EOMI. Normal peripheral fields. CN III-XII intact.  Grossly moves all extremities 4. Resting tremor. Coordination intact. Able and appropriate strength for age to upper and lower extremities bilaterally including grip strength. Sensation to light touch intact bilaterally for upper and lower. Gait able.   Skin: Skin is warm and dry. No rash noted. He is not diaphoretic.  Palms dry. No diaphoresis.   Psychiatric: He has a normal mood and affect.  Nursing note and vitals reviewed.    ED Treatments / Results  Labs (all labs ordered are listed, but only abnormal results are displayed) Labs Reviewed - No data to display  EKG  EKG Interpretation None       Radiology No results found.  Procedures Procedures (including critical care  time)  Medications Ordered in ED Medications - No data to display   Initial Impression / Assessment and Plan / ED Course  I have reviewed the triage vital signs and the nursing notes.  Pertinent labs & imaging results that were available during my care of the patient were reviewed by me and considered in my medical decision making (see chart for details).     This is a 51 year old male presenting to the ED today for alcohol detox. He was seen here yesterday for the same where he had labs done and was sent home with resources. The patient states he lost the resources as he was unaware he was given them. Vital signs and exam are reassuring. Patient with resting tremor documented in past. After discussion of the case with Dr. Erma Heritage, I will provide the patient with resource guide, librium taper along with folic acid, multivitamin, and thiamine supplementation.  I review the  patient in the [place] Controlled Substance Reporting System prior to rx of medication. Discussed with patient imporatance of not drinking while on medication and risks associated. Patient verbalized understanding and agreed to seek help with resources. Appears stable for discharge.  Final Clinical Impressions(s) / ED Diagnoses   Final diagnoses:  ETOH abuse    New Prescriptions Current Discharge Medication List       Princella Pellegrini 04/19/17 2248    Shaune Pollack, MD 04/20/17 1204

## 2017-04-19 NOTE — ED Notes (Signed)
PA Michael at bedside.

## 2017-04-19 NOTE — ED Notes (Signed)
Pt declined dc vitals.

## 2017-04-19 NOTE — ED Triage Notes (Signed)
Pt reports that he "hasn't eaten in 4 days and is trying to detox from alcohol." Pt reports last drink was 2-3 hours ago. Pt states he drinks "40 oz beer a day or as many as I can get"  Pt reports nausea, denies chest pain or shortness of breath.

## 2022-10-05 DEATH — deceased
